# Patient Record
Sex: Female | Born: 1935 | Race: White | Hispanic: No | Smoking: Never smoker
Health system: Southern US, Community
[De-identification: ages and names within clinical notes are randomized; demographics above are authoritative.]

## PROBLEM LIST (undated history)

## (undated) DIAGNOSIS — E7800 Pure hypercholesterolemia, unspecified: Secondary | ICD-10-CM

## (undated) DIAGNOSIS — K219 Gastro-esophageal reflux disease without esophagitis: Secondary | ICD-10-CM

## (undated) DIAGNOSIS — I10 Essential (primary) hypertension: Secondary | ICD-10-CM

## (undated) HISTORY — PX: NECK SURGERY: SHX720

## (undated) HISTORY — PX: CSF SHUNT: SHX92

## (undated) HISTORY — PX: APPENDECTOMY: SHX54

## (undated) HISTORY — PX: TONSILLECTOMY: SUR1361

## (undated) HISTORY — PX: ROTATOR CUFF REPAIR: SHX139

## (undated) HISTORY — PX: CATARACT EXTRACTION: SUR2

## (undated) HISTORY — PX: BACK SURGERY: SHX140

## (undated) HISTORY — PX: CORONARY ANGIOPLASTY WITH STENT PLACEMENT: SHX49

## (undated) HISTORY — PX: ABDOMINAL HYSTERECTOMY: SHX81

## (undated) HISTORY — PX: KNEE SURGERY: SHX244

---

## 1998-02-25 ENCOUNTER — Encounter: Payer: Self-pay | Admitting: *Deleted

## 1998-02-25 ENCOUNTER — Ambulatory Visit (HOSPITAL_COMMUNITY): Admission: RE | Admit: 1998-02-25 | Discharge: 1998-02-25 | Payer: Self-pay | Admitting: *Deleted

## 1998-05-15 ENCOUNTER — Ambulatory Visit (HOSPITAL_BASED_OUTPATIENT_CLINIC_OR_DEPARTMENT_OTHER): Admission: RE | Admit: 1998-05-15 | Discharge: 1998-05-15 | Payer: Self-pay | Admitting: Orthopedic Surgery

## 1998-06-18 ENCOUNTER — Encounter: Payer: Self-pay | Admitting: *Deleted

## 1998-06-18 ENCOUNTER — Ambulatory Visit (HOSPITAL_COMMUNITY): Admission: RE | Admit: 1998-06-18 | Discharge: 1998-06-18 | Payer: Self-pay | Admitting: *Deleted

## 1998-06-22 ENCOUNTER — Ambulatory Visit (HOSPITAL_COMMUNITY): Admission: RE | Admit: 1998-06-22 | Discharge: 1998-06-22 | Payer: Self-pay | Admitting: *Deleted

## 1998-06-22 ENCOUNTER — Encounter: Payer: Self-pay | Admitting: *Deleted

## 1998-07-03 ENCOUNTER — Encounter: Payer: Self-pay | Admitting: *Deleted

## 1998-07-03 ENCOUNTER — Ambulatory Visit (HOSPITAL_COMMUNITY): Admission: RE | Admit: 1998-07-03 | Discharge: 1998-07-03 | Payer: Self-pay | Admitting: *Deleted

## 1998-07-17 ENCOUNTER — Encounter: Payer: Self-pay | Admitting: *Deleted

## 1998-07-17 ENCOUNTER — Ambulatory Visit (HOSPITAL_COMMUNITY): Admission: RE | Admit: 1998-07-17 | Discharge: 1998-07-17 | Payer: Self-pay | Admitting: *Deleted

## 1998-07-31 ENCOUNTER — Ambulatory Visit (HOSPITAL_COMMUNITY): Admission: RE | Admit: 1998-07-31 | Discharge: 1998-07-31 | Payer: Self-pay | Admitting: *Deleted

## 1998-07-31 ENCOUNTER — Encounter: Payer: Self-pay | Admitting: *Deleted

## 1999-01-01 ENCOUNTER — Ambulatory Visit (HOSPITAL_COMMUNITY): Admission: RE | Admit: 1999-01-01 | Discharge: 1999-01-01 | Payer: Self-pay | Admitting: Orthopedic Surgery

## 1999-01-01 ENCOUNTER — Encounter: Payer: Self-pay | Admitting: Orthopedic Surgery

## 1999-01-17 ENCOUNTER — Encounter: Payer: Self-pay | Admitting: Orthopedic Surgery

## 1999-01-17 ENCOUNTER — Ambulatory Visit (HOSPITAL_COMMUNITY): Admission: RE | Admit: 1999-01-17 | Discharge: 1999-01-17 | Payer: Self-pay | Admitting: Orthopedic Surgery

## 1999-01-28 ENCOUNTER — Encounter: Payer: Self-pay | Admitting: Orthopedic Surgery

## 1999-01-28 ENCOUNTER — Ambulatory Visit (HOSPITAL_COMMUNITY): Admission: RE | Admit: 1999-01-28 | Discharge: 1999-01-28 | Payer: Self-pay | Admitting: Orthopedic Surgery

## 1999-02-21 ENCOUNTER — Inpatient Hospital Stay (HOSPITAL_COMMUNITY): Admission: RE | Admit: 1999-02-21 | Discharge: 1999-02-25 | Payer: Self-pay | Admitting: Orthopedic Surgery

## 1999-02-25 ENCOUNTER — Inpatient Hospital Stay (HOSPITAL_COMMUNITY)
Admission: RE | Admit: 1999-02-25 | Discharge: 1999-03-04 | Payer: Self-pay | Admitting: Physical Medicine & Rehabilitation

## 1999-03-11 ENCOUNTER — Ambulatory Visit: Admission: RE | Admit: 1999-03-11 | Discharge: 1999-03-11 | Payer: Self-pay | Admitting: Orthopedic Surgery

## 1999-10-16 ENCOUNTER — Encounter: Payer: Self-pay | Admitting: Orthopedic Surgery

## 1999-10-22 ENCOUNTER — Observation Stay (HOSPITAL_COMMUNITY): Admission: RE | Admit: 1999-10-22 | Discharge: 1999-10-23 | Payer: Self-pay | Admitting: Orthopedic Surgery

## 1999-12-23 ENCOUNTER — Encounter: Payer: Self-pay | Admitting: Orthopedic Surgery

## 1999-12-23 ENCOUNTER — Ambulatory Visit (HOSPITAL_COMMUNITY): Admission: RE | Admit: 1999-12-23 | Discharge: 1999-12-23 | Payer: Self-pay | Admitting: Orthopedic Surgery

## 2000-01-06 ENCOUNTER — Ambulatory Visit (HOSPITAL_COMMUNITY): Admission: RE | Admit: 2000-01-06 | Discharge: 2000-01-06 | Payer: Self-pay | Admitting: Orthopedic Surgery

## 2000-01-06 ENCOUNTER — Encounter: Payer: Self-pay | Admitting: Orthopedic Surgery

## 2000-01-20 ENCOUNTER — Encounter: Payer: Self-pay | Admitting: Orthopedic Surgery

## 2000-01-20 ENCOUNTER — Ambulatory Visit (HOSPITAL_COMMUNITY): Admission: RE | Admit: 2000-01-20 | Discharge: 2000-01-20 | Payer: Self-pay | Admitting: Orthopedic Surgery

## 2017-07-31 ENCOUNTER — Other Ambulatory Visit: Payer: Self-pay

## 2017-07-31 ENCOUNTER — Emergency Department (HOSPITAL_BASED_OUTPATIENT_CLINIC_OR_DEPARTMENT_OTHER): Payer: Medicare HMO

## 2017-07-31 ENCOUNTER — Encounter (HOSPITAL_BASED_OUTPATIENT_CLINIC_OR_DEPARTMENT_OTHER): Payer: Self-pay

## 2017-07-31 ENCOUNTER — Emergency Department (HOSPITAL_BASED_OUTPATIENT_CLINIC_OR_DEPARTMENT_OTHER)
Admission: EM | Admit: 2017-07-31 | Discharge: 2017-07-31 | Disposition: A | Discharge status: Home / Self Care | Payer: Medicare HMO | Attending: Emergency Medicine | Admitting: Emergency Medicine

## 2017-07-31 DIAGNOSIS — Z96651 Presence of right artificial knee joint: Secondary | ICD-10-CM | POA: Insufficient documentation

## 2017-07-31 DIAGNOSIS — M25561 Pain in right knee: Secondary | ICD-10-CM | POA: Diagnosis present

## 2017-07-31 DIAGNOSIS — Z955 Presence of coronary angioplasty implant and graft: Secondary | ICD-10-CM | POA: Diagnosis not present

## 2017-07-31 DIAGNOSIS — Z79899 Other long term (current) drug therapy: Secondary | ICD-10-CM | POA: Diagnosis not present

## 2017-07-31 DIAGNOSIS — I10 Essential (primary) hypertension: Secondary | ICD-10-CM | POA: Insufficient documentation

## 2017-07-31 HISTORY — DX: Pure hypercholesterolemia, unspecified: E78.00

## 2017-07-31 HISTORY — DX: Essential (primary) hypertension: I10

## 2017-07-31 HISTORY — DX: Gastro-esophageal reflux disease without esophagitis: K21.9

## 2017-07-31 NOTE — ED Provider Notes (Signed)
Emergency Department Provider Note   I have reviewed the triage vital signs and the nursing notes.   HISTORY  Chief Complaint Knee Pain   HPI Meghan Gonzalez is a 84 y.o. female with PMH of HLD, HTN, and GERD resents to the emergency department for evaluation of acute onset right knee pain.  The patient was turning to get out of the car when she felt pain in the lower part of the right knee.  She does have a history of knee replacement on that side.  He has had prior lower back pain and sciatica symptoms but none of that today.  She takes Vicodin at home for her lower back pain at times.  Denies any fevers, chills, leg swelling.  No falls to the ground or head trauma.  No numbness or tingling in the lower extremity.  Past Medical History:  Diagnosis Date  . GERD (gastroesophageal reflux disease)   . High cholesterol   . Hypertension     There are no active problems to display for this patient.   Past Surgical History:  Procedure Laterality Date  . ABDOMINAL HYSTERECTOMY    . APPENDECTOMY    . BACK SURGERY    . CATARACT EXTRACTION    . CORONARY ANGIOPLASTY WITH STENT PLACEMENT    . CSF SHUNT    . KNEE SURGERY    . NECK SURGERY    . ROTATOR CUFF REPAIR    . TONSILLECTOMY      Current Outpatient Rx  . Order #: [id number] Class: Historical Med  . Order #: [id number] Class: Historical Med  . Order #: [id number] Class: Historical Med  . Order #: [id number] Class: Historical Med  . Order #: [id number] Class: Historical Med    Allergies Patient has no known allergies.  No family history on file.  Social History Social History   Tobacco Use  . Smoking status: Never Smoker  . Smokeless tobacco: Never Used  Substance Use Topics  . Alcohol use: Not Currently    Frequency: Never  . Drug use: Never    Review of Systems  Constitutional: No fever/chills Eyes: No visual changes. ENT: No sore throat. Cardiovascular: Denies chest pain. Respiratory: Denies shortness of  breath. Gastrointestinal: No abdominal pain.  No nausea, no vomiting.  No diarrhea.  No constipation. Genitourinary: Negative for dysuria. Musculoskeletal: Right knee pain.  Skin: Negative for rash. Neurological: Negative for headaches, focal weakness or numbness.  10-point ROS otherwise negative.  ____________________________________________   PHYSICAL EXAM:  VITAL SIGNS: ED Triage Vitals  Enc Vitals Group     BP 07/31/17 1345 111/65     Pulse Rate 07/31/17 1345 83     Resp 07/31/17 1345 20     Temp 07/31/17 1345 98.3 F (36.8 C)     Temp Source 07/31/17 1345 Oral     SpO2 07/31/17 1345 97 %     Weight 07/31/17 1343 133 lb (60.3 kg)     Height 07/31/17 1343 4\' 10"  (1.473 m)     Pain Score 07/31/17 1337 10   Constitutional: Alert and oriented. Well appearing and in no acute distress. Eyes: Conjunctivae are normal.  Head: Atraumatic. Nose: No congestion/rhinnorhea. Mouth/Throat: Mucous membranes are moist.  Oropharynx non-erythematous. Neck: No stridor.  Cardiovascular: Good peripheral circulation.  Respiratory: Normal respiratory effort. Gastrointestinal: No distention.  Musculoskeletal: No gross deformities of extremities. Mild infra-patellar tenderness to palpation. Mild pain with ROM of the right knee. Normal hip and ankle ROM. No warmth or knee edema.  Neurologic:  Normal speech and language. No gross focal neurologic deficits are appreciated.  Skin:  Skin is warm, dry and intact. No rash noted.  ____________________________________________  RADIOLOGY  Dg Knee Complete 4 Views Right  Result Date: 07/31/2017 CLINICAL DATA:  Medial knee pain, history of replacement 14 years ago EXAM: RIGHT KNEE - COMPLETE 4+ VIEW COMPARISON:  None. FINDINGS: The femoral, patellar, and tibial portions of the knee replacement appear to be in good position. There is no evidence of prosthetic loosening by plain film. No bony demineralization or erosion is noted. There is no evidence of  joint effusion. Minimal periosteal reaction is noted along the proximal right tibia laterally of questionable significance. IMPRESSION: 1. Right total knee replacement components in good position with no complicating features noted. 2. No fracture.  No joint effusion. 3. Mild periosteal reaction along the lateral aspect of the proximal right tibia of questionable significance. Electronically Signed   By: Dwyane Dee M.D.   On: 07/31/2017 14:07    ____________________________________________   PROCEDURES  Procedure(s) performed:   Procedures  None ____________________________________________   INITIAL IMPRESSION / ASSESSMENT AND PLAN / ED COURSE  Pertinent labs & imaging results that were available during my care of the patient were reviewed by me and considered in my medical decision making (see chart for details).  Patient presents to the emergency department for evaluation of right knee pain after twisting.  She has no evidence of bony injury on the knee x-ray ordered from triage.  Normal range of motion of the ankle and hip on the right.  There is some mild periosteal reaction along the lateral aspect of the tibia with no tenderness on exam appreciated.  Advised RICE at home and PCP/Sports Med follow up PRN.   At this time, I do not feel there is any life-threatening condition present. I have reviewed and discussed all results (EKG, imaging, lab, urine as appropriate), exam findings with patient. I have reviewed nursing notes and appropriate previous records.  I feel the patient is safe to be discharged home without further emergent workup. Discussed usual and customary return precautions. Patient and family (if present) verbalize understanding and are comfortable with this plan.  Patient will follow-up with their primary care provider. If they do not have a primary care provider, information for follow-up has been provided to them. All questions have been  answered.  ____________________________________________  FINAL CLINICAL IMPRESSION(S) / ED DIAGNOSES  Final diagnoses:  Acute pain of right knee    Note:  This document was prepared using Dragon voice recognition software and may include unintentional dictation errors.  Alona Bene, MD Emergency Medicine    Long, Arlyss Repress, MD 07/31/17 [phone]

## 2017-07-31 NOTE — ED Triage Notes (Signed)
Pt states she had sudden onset of pain to right knee when she stood from car approx 11am-pt NAD-steady slow gait with own walker

## 2017-07-31 NOTE — Discharge Instructions (Signed)

## 2018-07-07 DEATH — deceased

## 2018-12-25 IMAGING — CR DG KNEE COMPLETE 4+V*R*
4 series · 4 of 4 positions shown · non-contrast
Comparison: None.

CLINICAL DATA: Medial knee pain, history of replacement 14 years
ago

EXAM:
RIGHT KNEE - COMPLETE 4+ VIEW

[t knee ap right]
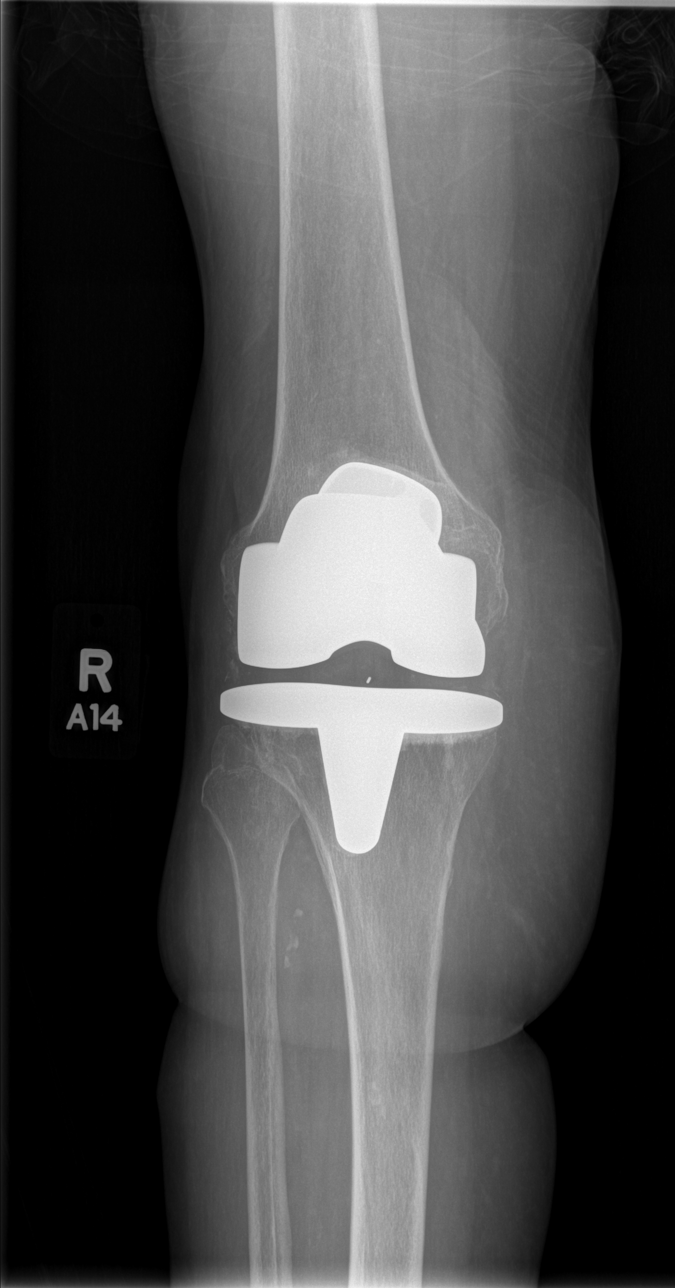

[t knee oblique right (1 of 2)]
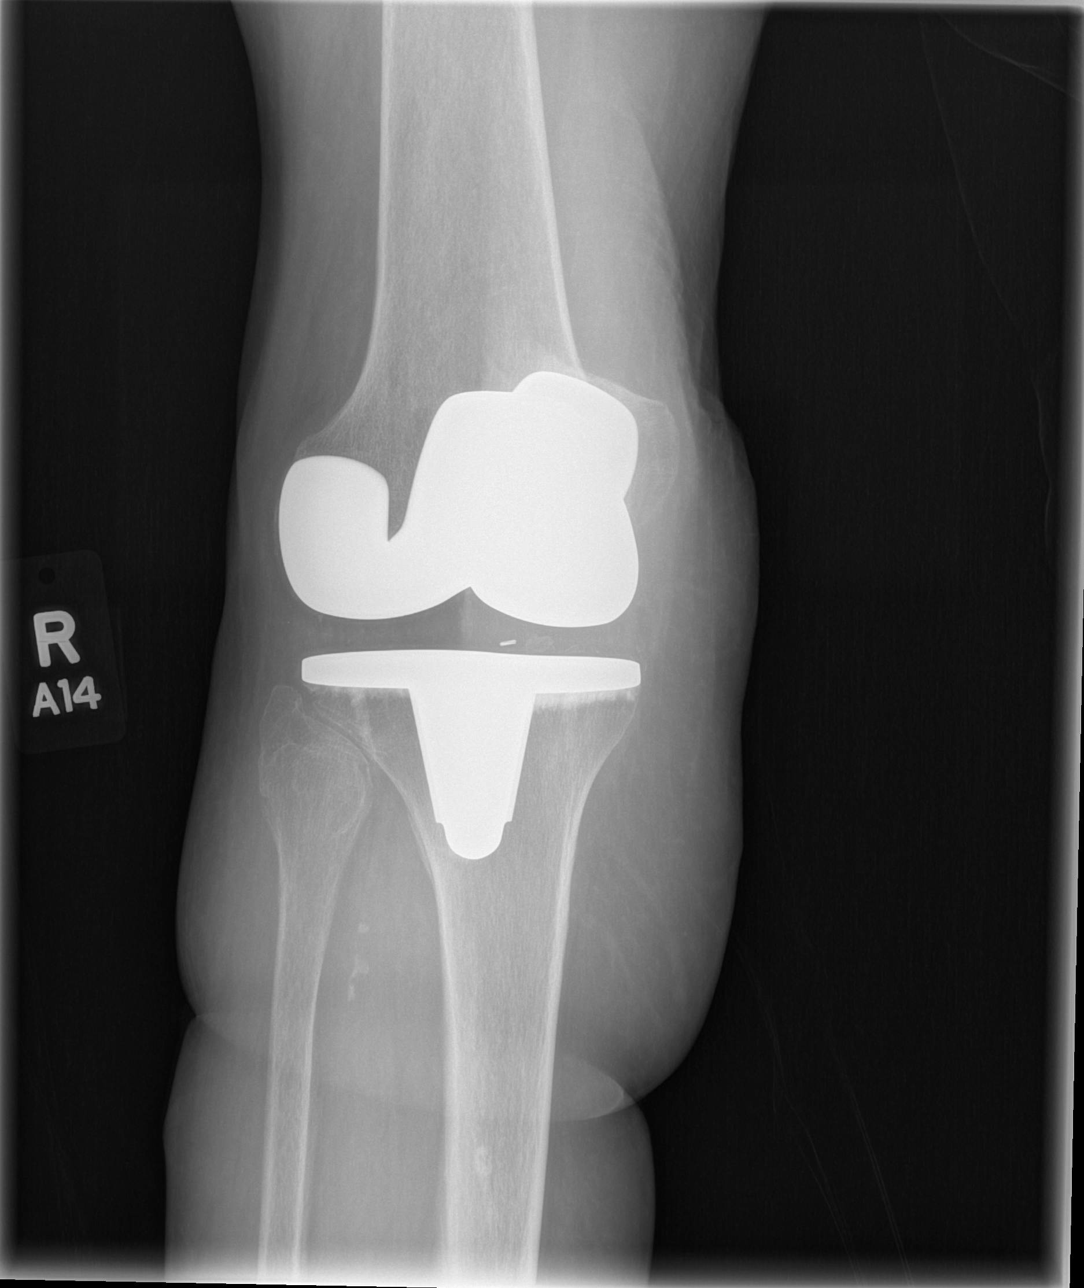

[t knee oblique right (2 of 2)]
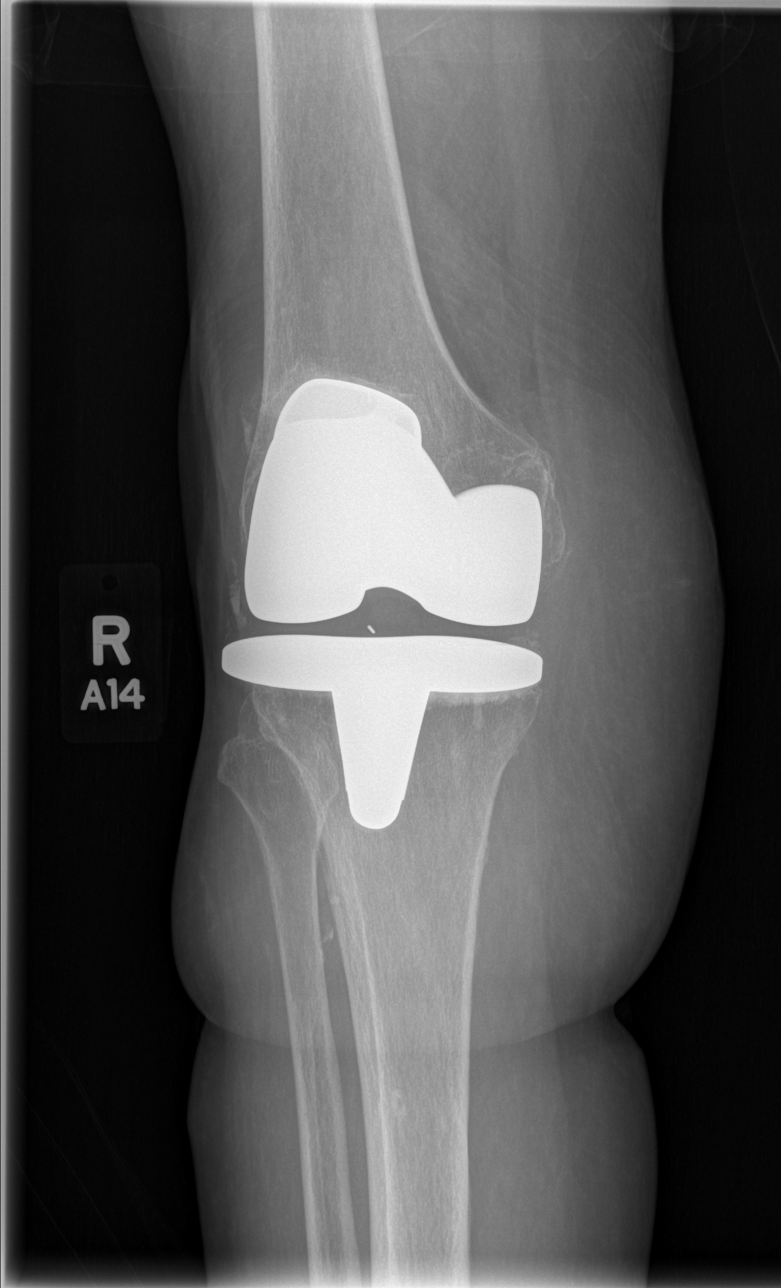

[t knee lat right]
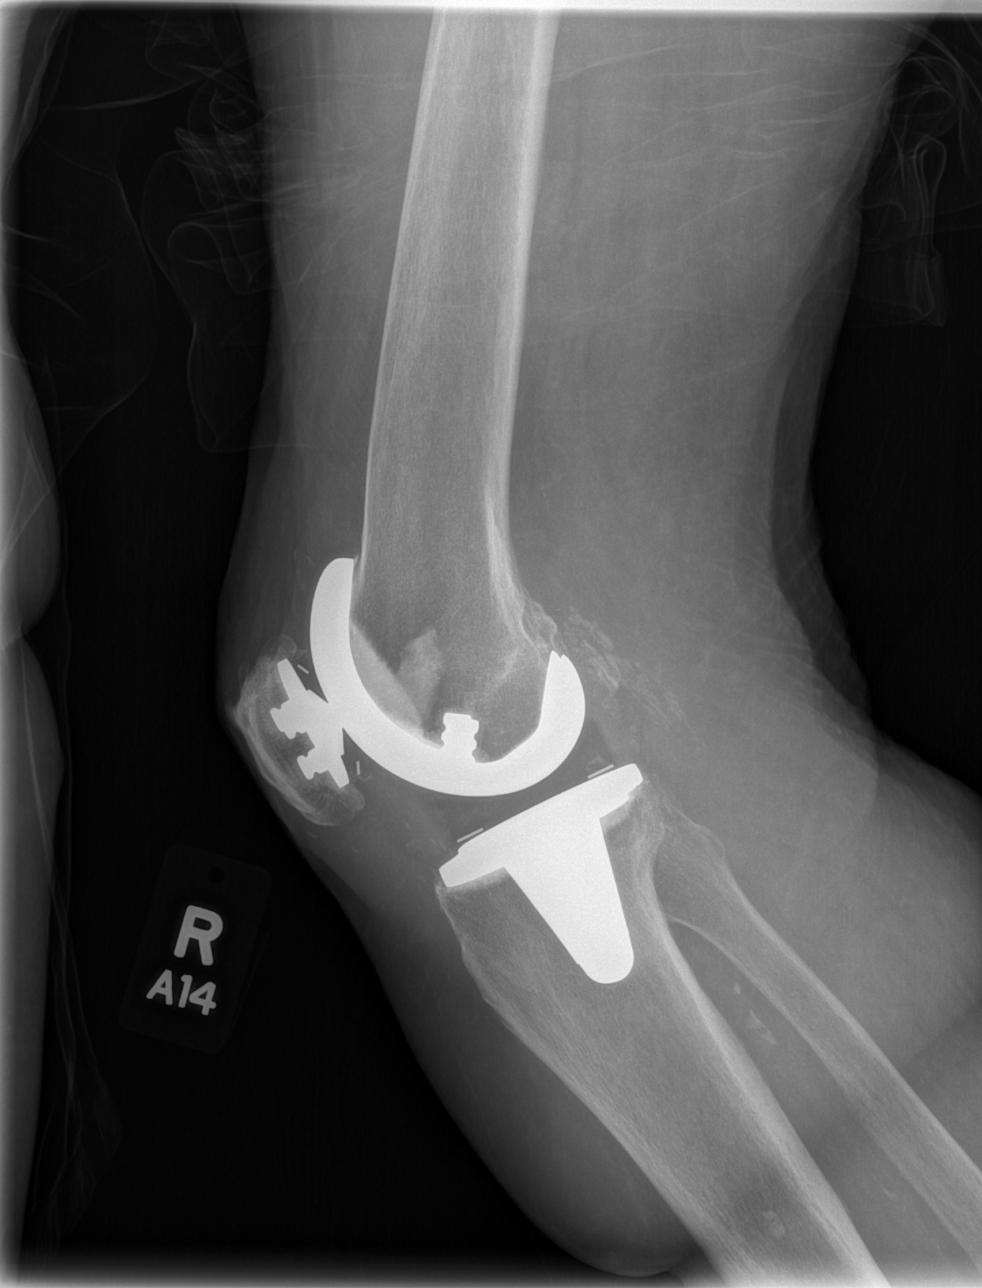

[4 of 4 positions shown; findings below may reference images not displayed]

FINDINGS: The femoral, patellar, and tibial portions of the knee replacement
appear to be in good position. There is no evidence of prosthetic
loosening by plain film. No bony demineralization or erosion is
noted. There is no evidence of joint effusion. Minimal periosteal
reaction is noted along the proximal right tibia laterally of
questionable significance.
IMPRESSION: 1. Right total knee replacement components in good position with no
complicating features noted.
2. No fracture.  No joint effusion.
3. Mild periosteal reaction along the lateral aspect of the proximal
right tibia of questionable significance.
